# Patient Record
Sex: Female | Born: 1990 | Race: White | Hispanic: No | Marital: Single | Smoking: Never smoker
Health system: Southern US, Community
[De-identification: ages and names within clinical notes are randomized; demographics above are authoritative.]

## PROBLEM LIST (undated history)

## (undated) HISTORY — PX: WISDOM TOOTH EXTRACTION: SHX21

---
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
## 2018-04-06 ENCOUNTER — Other Ambulatory Visit: Payer: Self-pay

## 2018-04-06 ENCOUNTER — Emergency Department (HOSPITAL_COMMUNITY)
Admission: EM | Admit: 2018-04-06 | Discharge: 2018-04-07 | Disposition: A | Discharge status: Home / Self Care | Payer: BLUE CROSS/BLUE SHIELD | Attending: Emergency Medicine | Admitting: Emergency Medicine

## 2018-04-06 ENCOUNTER — Encounter (HOSPITAL_COMMUNITY): Payer: Self-pay | Admitting: Emergency Medicine

## 2018-04-06 DIAGNOSIS — F418 Other specified anxiety disorders: Secondary | ICD-10-CM | POA: Diagnosis present

## 2018-04-06 DIAGNOSIS — F329 Major depressive disorder, single episode, unspecified: Secondary | ICD-10-CM | POA: Diagnosis not present

## 2018-04-06 DIAGNOSIS — R45851 Suicidal ideations: Secondary | ICD-10-CM | POA: Diagnosis not present

## 2018-04-06 DIAGNOSIS — F32A Depression, unspecified: Secondary | ICD-10-CM

## 2018-04-06 LAB — RAPID URINE DRUG SCREEN, HOSP PERFORMED
AMPHETAMINES: NOT DETECTED
Barbiturates: NOT DETECTED
Benzodiazepines: NOT DETECTED
Cocaine: NOT DETECTED
Opiates: NOT DETECTED
TETRAHYDROCANNABINOL: NOT DETECTED

## 2018-04-06 LAB — COMPREHENSIVE METABOLIC PANEL
ALT: 14 U/L (ref 0–44)
ANION GAP: 12 (ref 5–15)
AST: 15 U/L (ref 15–41)
Albumin: 4.8 g/dL (ref 3.5–5.0)
Alkaline Phosphatase: 73 U/L (ref 38–126)
BUN: 12 mg/dL (ref 6–20)
CO2: 21 mmol/L — ABNORMAL LOW (ref 22–32)
Calcium: 9.4 mg/dL (ref 8.9–10.3)
Chloride: 103 mmol/L (ref 98–111)
Creatinine, Ser: 0.7 mg/dL (ref 0.44–1.00)
GFR calc Af Amer: >60 mL/min (ref >60)
GFR calc non Af Amer: >60 mL/min (ref >60)
GLUCOSE: 93 mg/dL (ref 70–99)
Potassium: 3.6 mmol/L (ref 3.5–5.1)
Sodium: 136 mmol/L (ref 135–145)
Total Bilirubin: 0.6 mg/dL (ref 0.3–1.2)
Total Protein: 8.4 g/dL — ABNORMAL HIGH (ref 6.5–8.1)

## 2018-04-06 LAB — CBC
HCT: 46.7 % — ABNORMAL HIGH (ref 36.0–46.0)
HEMOGLOBIN: 15.1 g/dL — AB (ref 12.0–15.0)
MCH: 30.9 pg (ref 26.0–34.0)
MCHC: 32.3 g/dL (ref 30.0–36.0)
MCV: 95.7 fL (ref 80.0–100.0)
Platelets: 330 10*3/uL (ref 150–400)
RBC: 4.88 MIL/uL (ref 3.87–5.11)
RDW: 11.9 % (ref 11.5–15.5)
WBC: 14.3 10*3/uL — ABNORMAL HIGH (ref 4.0–10.5)
nRBC: 0 % (ref 0.0–0.2)

## 2018-04-06 LAB — I-STAT BETA HCG BLOOD, ED (MC, WL, AP ONLY): I-stat hCG, quantitative: <5 m[IU]/mL (ref <5)

## 2018-04-06 LAB — ACETAMINOPHEN LEVEL: Acetaminophen (Tylenol), Serum: <10 ug/mL — ABNORMAL LOW (ref 10–30)

## 2018-04-06 LAB — SALICYLATE LEVEL: Salicylate Lvl: <7 mg/dL (ref 2.8–30.0)

## 2018-04-06 LAB — ETHANOL: Alcohol, Ethyl (B): <10 mg/dL (ref <10)

## 2018-04-06 NOTE — ED Provider Notes (Signed)
[facility]-EMERGENCY DEPT Provider Note   CSN: [id number] Arrival date & time: 04/06/18  2004     History   Chief Complaint Chief Complaint  Patient presents with  . Suicidal    HPI Kendra Mcdonald is a 28 y.o. female.  HPI   Patient is a 28 year old female who presents the emergency department today for evaluation of suicidal ideation.  Patient states that she has been having a lot of increased stress and depression lately because she recently separated from her wife. She has also been I am able to see her son recently because of the separation.  Because of the stress she has been having suicidal thoughts for the last several weeks.  She has no specific plan in place.  She is never attempted suicide in the past.  She has had issues with anxiety/depression in the past and has been to counseling however she is no longer in counseling currently.  She is never been medicated for anxiety depression in the past.  She denies any homicidal ideations, auditory or visual hallucinations.  She denies any medical complaints including no chest pain, shortness of breath, abdominal pain, fevers chills nausea vomiting diarrhea or urinary symptoms.  History reviewed. No pertinent past medical history.  There are no active problems to display for this patient.   Past Surgical History:  Procedure Laterality Date  . WISDOM TOOTH EXTRACTION       OB History   No obstetric history on file.      Home Medications    Prior to Admission medications   Medication Sig Start Date End Date Taking? Authorizing Provider  dextromethorphan-guaiFENesin (MUCINEX DM) 30-600 MG 12hr tablet Take 1 tablet by mouth 2 (two) times daily as needed for cough (congestion).   Yes [provider]    Family History No family history on file.  Social History Social History   Tobacco Use  . Smoking status: Never Smoker  . Smokeless tobacco: Never Used  Substance Use Topics  . Alcohol  use: Yes    Comment: Few times a week  . Drug use: Not Currently     Allergies   Patient has no known allergies.   Review of Systems Review of Systems  Constitutional: Negative for chills and fever.  HENT: Negative for ear pain and sore throat.   Eyes: Negative for visual disturbance.  Respiratory: Negative for cough and shortness of breath.   Cardiovascular: Negative for chest pain.  Gastrointestinal: Negative for abdominal pain, diarrhea, nausea and vomiting.  Genitourinary: Negative for dysuria and hematuria.  Musculoskeletal: Negative for back pain.  Skin: Negative for color change and rash.  Neurological: Negative for headaches.  Psychiatric/Behavioral: Positive for dysphoric mood, sleep disturbance and suicidal ideas. Negative for hallucinations. The patient is nervous/anxious.        No hi or avh  All other systems reviewed and are negative.    Physical Exam Updated Vital Signs BP (!) 155/97 (BP Location: Left Arm)   Pulse (!) 101   Temp (!) 97.5 F (36.4 C) (Oral)   Resp (!) 22   Ht 5\' 3"  (1.6 m)   Wt 65.8 kg   LMP 03/23/2018   SpO2 100%   BMI 25.69 kg/m   Physical Exam Vitals signs and nursing note reviewed.  Constitutional:      General: She is not in acute distress.    Appearance: Normal appearance. She is well-developed.  HENT:     Head: Normocephalic and atraumatic.  Nose: Nose normal.     Mouth/Throat:     Pharynx: No oropharyngeal exudate or posterior oropharyngeal erythema.  Eyes:     Extraocular Movements: Extraocular movements intact.     Conjunctiva/sclera: Conjunctivae normal.     Pupils: Pupils are equal, round, and reactive to light.  Neck:     Musculoskeletal: Neck supple.  Cardiovascular:     Rate and Rhythm: Normal rate and regular rhythm.     Heart sounds: Normal heart sounds. No murmur.  Pulmonary:     Effort: Pulmonary effort is normal. No respiratory distress.     Breath sounds: Normal breath sounds. No stridor. No  wheezing or rhonchi.  Abdominal:     Palpations: Abdomen is soft.     Tenderness: There is no abdominal tenderness. There is no guarding or rebound.  Skin:    General: Skin is warm and dry.  Neurological:     Mental Status: She is alert.  Psychiatric:        Behavior: Behavior normal.        Thought Content: Thought content normal.        Judgment: Judgment normal.     Comments: Anxious, tearful      ED Treatments / Results  Labs (all labs ordered are listed, but only abnormal results are displayed) Labs Reviewed  COMPREHENSIVE METABOLIC PANEL - Abnormal; Notable for the following components:      Result Value   CO2 21 (*)    Total Protein 8.4 (*)    All other components within normal limits  ACETAMINOPHEN LEVEL - Abnormal; Notable for the following components:   Acetaminophen (Tylenol), Serum <10 (*)    All other components within normal limits  CBC - Abnormal; Notable for the following components:   WBC 14.3 (*)    Hemoglobin 15.1 (*)    HCT 46.7 (*)    All other components within normal limits  ETHANOL  SALICYLATE LEVEL  RAPID URINE DRUG SCREEN, HOSP PERFORMED  I-STAT BETA HCG BLOOD, ED ([facility], [facility], [facility] ONLY)    EKG None  Radiology No results found.  Procedures Procedures (including critical care time)  Medications Ordered in ED Medications - No data to display   Initial Impression / Assessment and Plan / ED Course  I have reviewed the triage vital signs and the nursing notes.  Pertinent labs & imaging results that were available during my care of the patient were reviewed by me and considered in my medical decision making (see chart for details).     Final Clinical Impressions(s) / ED Diagnoses   Final diagnoses:  Suicidal ideation   Patient presented for suicidal ideations after multiple stressors including stressors with her significant other her job and her son.  She has no plan in place.  She has never attempted suicide in the past.  She does have  history of anxiety/depression but is not currently on medications or receiving therapy for this.  Denies drug use. Does drink etoh occasionally, has been drinking more lately secondary to stressors. No medical complaints.  Labs with slight leukocytosis, slightly elevated hemoglobin as well, suspect hemoconcentration.  CMP with normal kidney and liver function.  Electrolytes are normal.  EtOH, salicylate and acetaminophen levels are normal.  UDS is negative.  Patient has no acute medical problems at this time that would require further admission or work-up at this time.  She is appropriate for evaluation by behavioral health.  Patient evaluated by behavioral health who recommends overnight observation and evaluation  by psychiatrist in the a.m.  ED Discharge Orders    None       Rayne Du 04/07/18 0004    Tegeler, Canary Brim, MD 04/07/18 [phone]
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
## 2018-04-06 NOTE — ED Notes (Signed)
Bed: [facility]41 Expected date:  Expected time:  Means of arrival:  Comments: 

## 2018-04-06 NOTE — Progress Notes (Signed)
Received Kendra Mcdonald from the main  ED, alert, but sleepy. She went to bed. She denied feeling suicidal, but endorsed feeling depressed. She slept throughout the night in the [facility].

## 2018-04-06 NOTE — ED Notes (Signed)
[facility] TTS in evaluating patient

## 2018-04-06 NOTE — ED Triage Notes (Signed)
Pt presents with suicidal ideation. Patient states feelings have been on and off but tonight it has been worse. Patient stating she has had some issues at work and is going through a separation. Patient states that she does have access to guns but does not have a plan.

## 2018-04-07 DIAGNOSIS — R45851 Suicidal ideations: Secondary | ICD-10-CM

## 2018-04-07 DIAGNOSIS — F32A Depression, unspecified: Secondary | ICD-10-CM | POA: Diagnosis present

## 2018-04-07 DIAGNOSIS — F329 Major depressive disorder, single episode, unspecified: Secondary | ICD-10-CM

## 2018-04-07 NOTE — Consult Note (Signed)
[facility] ED Discharge  04/07/2018 4:08 PM Kendra Mcdonald  MRN:  [id number] Principal Problem: Depression Discharge Diagnoses: Principal Problem:   Depression   Subjective: Per chart review, patient was admitted with SI in the setting of multiple psychosocial stressors. Today she denies SI, HI or AVH. 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 She recently separated from her wife and has been unable to see her 67 y/o son. She denies a prior psychiatric history. She denies a history of suicide attempts. She has received counseling services in the past. She is agreeable to following up with outpatient mental health services. She provides verbal consent to speak to her grandmother. She was contacted. She agrees to remove weapons from her home and will be part of her support network.   Total Time spent with patient: 30 minutes  Past Psychiatric History: Denies   Past Medical History: History reviewed. No pertinent past medical history.  Past Surgical History:  Procedure Laterality Date  . WISDOM TOOTH EXTRACTION     Family History: No family history on file. Family Psychiatric  History: Denies  Social History:  Social History   Substance and Sexual Activity  Alcohol Use Yes   Comment: Few times a week     Social History   Substance and Sexual Activity  Drug Use Not Currently    Social History   Socioeconomic History  . Marital status: Single    Spouse name: Not on file  . Number of children: Not on file  . Years of education: Not on file  . Highest education level: Not on file  Occupational History  . Not on file  Social Needs  . Financial resource strain: Not on file  . Food insecurity:    Worry: Not on file    Inability: Not on file  . Transportation needs:    Medical: Not on file    Non-medical: Not on file  Tobacco Use  . Smoking status: Never Smoker  . Smokeless tobacco: Never Used  Substance and Sexual Activity   . Alcohol use: Yes    Comment: Few times a week  . Drug use: Not Currently  . Sexual activity: Not on file  Lifestyle  . Physical activity:    Days per week: Not on file    Minutes per session: Not on file  . Stress: Not on file  Relationships  . Social connections:    Talks on phone: Not on file    Gets together: Not on file    Attends religious service: Not on file    Active member of club or organization: Not on file    Attends meetings of clubs or organizations: Not on file    Relationship status: Not on file  Other Topics Concern  . Not on file  Social History Narrative  . Not on file    Has this patient used any form of tobacco in the last 30 days? (Cigarettes, Smokeless Tobacco, Cigars, and/or Pipes) Prescription not provided because: Patient does not use tobacco products.  Current Medications: No current facility-administered medications for this encounter.    Current Outpatient Medications  Medication Sig Dispense Refill  . dextromethorphan-guaiFENesin (MUCINEX DM) 30-600 MG 12hr tablet Take 1 tablet by mouth 2 (two) times daily as needed for cough (congestion).     PTA Medications: (Not in a hospital admission)   Musculoskeletal: Strength & Muscle Tone: within normal limits Gait & Station: normal Patient leans: N/A  Psychiatric  Specialty Exam: Physical Exam  Nursing note and vitals reviewed. Constitutional: She is oriented to person, place, and time. She appears well-developed and well-nourished.  HENT:  Head: Normocephalic and atraumatic.  Neck: Normal range of motion.  Respiratory: Effort normal.  Musculoskeletal: Normal range of motion.  Neurological: She is alert and oriented to person, place, and time.  Psychiatric: Her speech is normal and behavior is normal. Judgment and thought content normal. Cognition and memory are normal. She exhibits a depressed mood.    Review of Systems  Psychiatric/Behavioral: Positive for depression. Negative for  hallucinations and suicidal ideas.  All other systems reviewed and are negative.   Blood pressure 123/72, pulse (!) 8, temperature 98.3 F (36.8 C), temperature source Oral, resp. rate 16, height 5\' 3"  (1.6 m), weight 65.8 kg, last menstrual period 03/23/2018, SpO2 96 %.Body mass index is 25.69 kg/m.  General Appearance: Fairly Groomed, young, Caucasian female, wearing paper hospital scrubs and lying in bed. NAD.   Eye Contact:  Good  Speech:  Clear and Coherent and Normal Rate  Volume:  Normal  Mood:  Depressed  Affect:  Congruent and Tearful  Thought Process:  Goal Directed, Linear and Descriptions of Associations: Intact  Orientation:  Full (Time, Place, and Person)  Thought Content:  Logical  Suicidal Thoughts:  No  Homicidal Thoughts:  No  Memory:  Immediate;   Good Recent;   Good Remote;   Good  Judgement:  Fair  Insight:  Fair  Psychomotor Activity:  Normal  Concentration:  Concentration: Good and Attention Span: Good  Recall:  Good  Fund of Knowledge:  Good  Language:  Good  Akathisia:  No  Handed:  Right  AIMS (if indicated):   N/A  Assets:  Communication Skills Desire for Improvement Financial Resources/Insurance Housing Physical Health Social Support  ADL's:  Intact  Cognition:  WNL  Sleep:   N/A     Demographic Factors:  Caucasian, Gay, lesbian, or bisexual orientation and Access to firearms  Loss Factors: Loss of significant relationship  Historical Factors: NA  Risk Reduction Factors:   Responsible for children under 26 years of age and Positive social support  Continued Clinical Symptoms:  Depression  Cognitive Features That Contribute To Risk:  None    Suicide Risk:  Minimal: No identifiable suicidal ideation.  Patients presenting with no risk factors but with morbid ruminations; may be classified as minimal risk based on the severity of the depressive symptoms  Assessment: Kendra Mcdonald is a 28 y.o. female who was admitted with SI in  the setting of psychosocial stressors. She denies current SI, HI or AVH. Her grandmother will assist with safety planning and remove weapons from her home. She is agreeable to following up with outpatient mental health services. She does not warrant inpatient psychiatric hospitalization at this time.   Plan Of Care/Follow-up recommendations:  -Patient will be provided with resources for outpatient mental health services.   Disposition: Discharge home.  Cherly BeachJacqueline J Benuel Ly, DO 04/07/2018, 4:08 PM

## 2018-04-07 NOTE — ED Notes (Signed)
Pt discharged home. Discharged instructions read to pt who verbalized understanding. All belongings returned to pt who signed for same. Denies SI/HI, is not delusional and not responding to internal stimuli. Escorted pt to the ED exit.  Pt's grandmother picked her up.

## 2018-04-07 NOTE — [facility] Counselor (Addendum)
TTS left a voice mail for patient's grandmother, Dondra SpryGail at [phone], to obtain collateral information. TTS left an additional voice mail on her cell phone [phone].  @12 :4941- Spoke with Dondra SpryGail and her other grandma, Bonita QuinLinda, who agreed to go to patient's home and ensure that she does not have access to weapons.

## 2018-04-07 NOTE — BH Assessment (Signed)
[facility] Assessment Progress Note  Per Jacqueline Norman, DO, this pt does not require psychiatric hospitalization at this time.  [facility] is to be discharged from [facility] with recommendation to follow up with [facility].  This has been included in pt's discharge instructions.  Pt's nurse, Diane, has been notified.  Sriman Tally, MA Triage Specialist [phone]     

## 2018-04-07 NOTE — Discharge Instructions (Signed)
For your behavioral health needs you are advised to follow up with [facility].  New patients are seen at their walk-in clinic.  Walk-in hours are Monday - Friday from 8:00 am - 12:00 pm, and from 1:00 pm - 3:00 pm.  Walk-in patients are seen on a first come, first served basis, so try to arrive as early as possible for the best chance of being seen the same day.  There is an initial fee of $22.50: ° °     [facility] °     [address] °     [place],  [address] °     [phone] °

## 2018-10-06 ENCOUNTER — Telehealth: Payer: BLUE CROSS/BLUE SHIELD | Admitting: Family

## 2018-10-06 DIAGNOSIS — J069 Acute upper respiratory infection, unspecified: Secondary | ICD-10-CM

## 2018-10-06 MED ORDER — PROMETHAZINE-DM 6.25-15 MG/5ML PO SYRP: 5.0000 mL | ORAL_SOLUTION | Freq: Four times a day (QID) | ORAL | 0 refills | Status: DC | PRN

## 2018-10-06 NOTE — Progress Notes (Signed)
E-Visit for Corona Virus Screening   Based on your current symptoms, it seems unlikely that your symptoms are related to the Coronavirus.    This appear to be more of an upper respiratory infection.  COVID-19 is a respiratory illness with symptoms that are similar to the flu. Symptoms are typically mild to moderate, but there have been cases of severe illness and death due to the virus. The following symptoms may appear 2-14 days after exposure: . Fever . Cough . Shortness of breath or difficulty breathing . Chills . Repeated shaking with chills . Muscle pain . Headache . Sore throat . New loss of taste or smell . Fatigue . Congestion or runny nose . Nausea or vomiting . Diarrhea  It is vitally important that if you feel that you have an infection such as this virus or any other virus that you stay home and away from places where you may spread it to others.  You should self-quarantine for 14 days if you have symptoms that could potentially be coronavirus or have been in close contact a with a person diagnosed with COVID-19 within the last 2 weeks. You should avoid contact with people age 26 and older.   You should wear a mask or cloth face covering over your nose and mouth if you must be around other people or animals, including pets (even at home). Try to stay at least 6 feet away from other people. This will protect the people around you.  You can use medication such as A prescription cough medication called Phenergan DM 6.25 mg/15 mg. You make take one teaspoon / 5 ml every 4-6 hours as needed for cough  You may also take acetaminophen (Tylenol) as needed for fever.   Reduce your risk of any infection by using the same precautions used for avoiding the common cold or flu:  Marland Kitchen. Wash your hands often with soap and warm water for at least 20 seconds.  If soap and water are not readily available, use an alcohol-based hand sanitizer with at least 60% alcohol.  . If coughing or sneezing,  cover your mouth and nose by coughing or sneezing into the elbow areas of your shirt or coat, into a tissue or into your sleeve (not your hands). . Avoid shaking hands with others and consider head nods or verbal greetings only. . Avoid touching your eyes, nose, or mouth with unwashed hands.  . Avoid close contact with people who are sick. . Avoid places or events with large numbers of people in one location, like concerts or sporting events. . Carefully consider travel plans you have or are making. . If you are planning any travel outside or inside the [place], visit the CDC's Travelers' Health webpage for the latest health notices. . If you have some symptoms but not all symptoms, continue to monitor at home and seek medical attention if your symptoms worsen. . If you are having a medical emergency, call 911.  HOME CARE . Only take medications as instructed by your medical team. . Drink plenty of fluids and get plenty of rest. . A steam or ultrasonic humidifier can help if you have congestion.   GET HELP RIGHT AWAY IF YOU HAVE EMERGENCY WARNING SIGNS** FOR COVID-19. If you or someone is showing any of these signs seek emergency medical care immediately. Call 911 or proceed to your closest emergency facility if: . You develop worsening high fever. . Trouble breathing . Bluish lips or face . Persistent pain or pressure  in the chest . New confusion . Inability to wake or stay awake . You cough up blood. . Your symptoms become more severe  **This list is not all possible symptoms. Contact your medical provider for any symptoms that are sever or concerning to you.   MAKE SURE YOU   Understand these instructions.  Will watch your condition.  Will get help right away if you are not doing well or get worse.  Your e-visit answers were reviewed by a board certified advanced clinical practitioner to complete your personal care plan.  Depending on the condition, your plan could have included  both over the counter or prescription medications.  If there is a problem please reply once you have received a response from your provider.  Your safety is important to [place].  If you have drug allergies check your prescription carefully.    You can use MyChart to ask questions about today's visit, request a non-urgent call back, or ask for a work or school excuse for 24 hours related to this e-Visit. If it has been greater than 24 hours you will need to follow up with your provider, or enter a new e-Visit to address those concerns. You will get an e-mail in the next two days asking about your experience.  I hope that your e-visit has been valuable and will speed your recovery. Thank you for using e-visits.  Greater than 5 minutes, yet less than 10 minutes of time have been spent researching, coordinating, and implementing care for this patient today.  Thank you for the details you included in the comment boxes. Those details are very helpful in determining the best course of treatment for you and help [place] to provide the best care.

## 2019-07-09 ENCOUNTER — Ambulatory Visit: Payer: Medicaid Other

## 2019-07-09 ENCOUNTER — Ambulatory Visit: Payer: Medicaid Other | Attending: Internal Medicine

## 2019-07-09 DIAGNOSIS — Z23 Encounter for immunization: Secondary | ICD-10-CM

## 2019-07-09 NOTE — Progress Notes (Signed)
   Covid-19 Vaccination Clinic  Name:  Kendra Mcdonald    MRN: [id number] DOB: 1990/11/27  07/09/2019  [name] was observed post Covid-19 immunization for 15 minutes without incident. She was provided with Vaccine Information Sheet and instruction to access the V-Safe system.   [name] was instructed to call 911 with any severe reactions post vaccine: Marland Kitchen Difficulty breathing  . Swelling of face and throat  . A fast heartbeat  . A bad rash all over body  . Dizziness and weakness   Immunizations Administered    Name Date Dose VIS Date Route   Pfizer COVID-19 Vaccine 07/09/2019  3:07 PM 0.3 mL 03/12/2019 Intramuscular   Manufacturer: [employer], [employer]   Lot: [id number]   NDC: [address]-3

## 2019-08-02 ENCOUNTER — Ambulatory Visit: Payer: Medicaid Other | Attending: Internal Medicine

## 2019-08-02 DIAGNOSIS — Z23 Encounter for immunization: Secondary | ICD-10-CM

## 2019-08-02 NOTE — Progress Notes (Signed)
   Covid-19 Vaccination Clinic  Name:  Kendra Mcdonald    MRN: [id number] DOB: [birth year]  08/02/2019  [name] was observed post Covid-19 immunization for 15 minutes without incident. She was provided with Vaccine Information Sheet and instruction to access the V-Safe system.   [name] was instructed to call 911 with any severe reactions post vaccine: Marland Kitchen Difficulty breathing  . Swelling of face and throat  . A fast heartbeat  . A bad rash all over body  . Dizziness and weakness   Immunizations Administered    Name Date Dose VIS Date Route   Pfizer COVID-19 Vaccine 08/02/2019 11:10 AM 0.3 mL 05/26/2018 Intramuscular   Manufacturer: [employer], [employer]   Lot: [id number]   NDC: [address]-3

## 2020-05-19 ENCOUNTER — Encounter (HOSPITAL_COMMUNITY): Payer: Self-pay | Admitting: Emergency Medicine

## 2020-05-19 ENCOUNTER — Emergency Department (HOSPITAL_COMMUNITY): Payer: No Typology Code available for payment source

## 2020-05-19 ENCOUNTER — Emergency Department (HOSPITAL_COMMUNITY)
Admission: EM | Admit: 2020-05-19 | Discharge: 2020-05-19 | Disposition: A | Discharge status: Home / Self Care | Payer: No Typology Code available for payment source | Attending: Emergency Medicine | Admitting: Emergency Medicine

## 2020-05-19 DIAGNOSIS — R1032 Left lower quadrant pain: Secondary | ICD-10-CM | POA: Diagnosis present

## 2020-05-19 DIAGNOSIS — N200 Calculus of kidney: Secondary | ICD-10-CM | POA: Insufficient documentation

## 2020-05-19 LAB — CBC WITH DIFFERENTIAL/PLATELET
Abs Immature Granulocytes: 0.05 10*3/uL (ref 0.00–0.07)
Basophils Absolute: 0 10*3/uL (ref 0.0–0.1)
Basophils Relative: 0 %
Eosinophils Absolute: 0.1 10*3/uL (ref 0.0–0.5)
Eosinophils Relative: 1 %
HCT: 40.9 % (ref 36.0–46.0)
Hemoglobin: 13.4 g/dL (ref 12.0–15.0)
Immature Granulocytes: 1 %
Lymphocytes Relative: 13 %
Lymphs Abs: 1.4 10*3/uL (ref 0.7–4.0)
MCH: 30.9 pg (ref 26.0–34.0)
MCHC: 32.8 g/dL (ref 30.0–36.0)
MCV: 94.5 fL (ref 80.0–100.0)
Monocytes Absolute: 0.5 10*3/uL (ref 0.1–1.0)
Monocytes Relative: 5 %
Neutro Abs: 8.4 10*3/uL — ABNORMAL HIGH (ref 1.7–7.7)
Neutrophils Relative %: 80 %
Platelets: 268 10*3/uL (ref 150–400)
RBC: 4.33 MIL/uL (ref 3.87–5.11)
RDW: 12.1 % (ref 11.5–15.5)
WBC: 10.5 10*3/uL (ref 4.0–10.5)
nRBC: 0 % (ref 0.0–0.2)

## 2020-05-19 LAB — URINALYSIS, ROUTINE W REFLEX MICROSCOPIC
Bilirubin Urine: NEGATIVE
Glucose, UA: 50 mg/dL — AB
Ketones, ur: NEGATIVE mg/dL
Leukocytes,Ua: NEGATIVE
Nitrite: NEGATIVE
Protein, ur: NEGATIVE mg/dL
RBC / HPF: >50 RBC/hpf — ABNORMAL HIGH (ref 0–5)
Specific Gravity, Urine: 1.02 (ref 1.005–1.030)
pH: 5 (ref 5.0–8.0)

## 2020-05-19 LAB — COMPREHENSIVE METABOLIC PANEL
ALT: 14 U/L (ref 0–44)
AST: 17 U/L (ref 15–41)
Albumin: 4.5 g/dL (ref 3.5–5.0)
Alkaline Phosphatase: 70 U/L (ref 38–126)
Anion gap: 10 (ref 5–15)
BUN: 12 mg/dL (ref 6–20)
CO2: 22 mmol/L (ref 22–32)
Calcium: 8.8 mg/dL — ABNORMAL LOW (ref 8.9–10.3)
Chloride: 104 mmol/L (ref 98–111)
Creatinine, Ser: 0.84 mg/dL (ref 0.44–1.00)
GFR, Estimated: >60 mL/min (ref >60)
Glucose, Bld: 115 mg/dL — ABNORMAL HIGH (ref 70–99)
Potassium: 3.5 mmol/L (ref 3.5–5.1)
Sodium: 136 mmol/L (ref 135–145)
Total Bilirubin: 0.5 mg/dL (ref 0.3–1.2)
Total Protein: 7.4 g/dL (ref 6.5–8.1)

## 2020-05-19 LAB — I-STAT BETA HCG BLOOD, ED (MC, WL, AP ONLY): I-stat hCG, quantitative: <5 m[IU]/mL (ref <5)

## 2020-05-19 MED ORDER — ONDANSETRON HCL 4 MG/2ML IJ SOLN
4.0000 mg | Freq: Once | INTRAMUSCULAR | Status: AC
  Administered 2020-05-19: 4 mg via INTRAVENOUS
  Filled 2020-05-19: qty 2

## 2020-05-19 MED ORDER — SODIUM CHLORIDE 0.9 % IV BOLUS
500.0000 mL | Freq: Once | INTRAVENOUS | Status: AC
  Administered 2020-05-19: 500 mL via INTRAVENOUS

## 2020-05-19 MED ORDER — HYDROMORPHONE HCL 1 MG/ML IJ SOLN
0.5000 mg | Freq: Once | INTRAMUSCULAR | Status: AC
  Administered 2020-05-19: 0.5 mg via INTRAVENOUS
  Filled 2020-05-19: qty 1

## 2020-05-19 MED ORDER — HYDROMORPHONE HCL 1 MG/ML IJ SOLN
1.0000 mg | Freq: Once | INTRAMUSCULAR | Status: AC
  Administered 2020-05-19: 1 mg via INTRAVENOUS
  Filled 2020-05-19: qty 1

## 2020-05-19 NOTE — Discharge Instructions (Signed)
Continue with oral hydration at home.  Take Motrin Tylenol as needed directed for pain. You can strain your urine to try and catch the kidney stone. Follow-up with urology for any concerns, return to the emergency room for worsening or concerning symptoms.

## 2020-05-19 NOTE — ED Triage Notes (Signed)
Pt reports LLQ abdominal pain that started suddenly around 4a. Worse with palpation. Reports nausea. Denies vomiting or diarrhea. No surgical hx.

## 2020-05-19 NOTE — ED Provider Notes (Signed)
[facility]-EMERGENCY DEPT Provider Note   CSN: [id number] Arrival date & time: 05/19/20  9562     History Chief Complaint  Patient presents with  . Abdominal Pain    Kendra Mcdonald is a 30 y.o. female.  30 year old female with no significant past medical history presents with complaint of left lower abdominal pain onset 4 AM, woke her from her sleep, sharp and aching in nature, constant.  Patient tried to go to the bathroom to relieve her pain, states pain was worse with flexing her hip to sit on the commode.  Reports daily bowel movements, denies constipation, changes in bladder habits, vaginal discharge.  Denies possibility of pregnancy.  Reports nausea without vomiting.  Denies fevers or chills.  No history of ovarian cyst or similar pain previously.  No prior abdominal surgeries.        History reviewed. No pertinent past medical history.  Patient Active Problem List   Diagnosis Date Noted  . Depression     Past Surgical History:  Procedure Laterality Date  . WISDOM TOOTH EXTRACTION       OB History   No obstetric history on file.     History reviewed. No pertinent family history.  Social History   Tobacco Use  . Smoking status: Never Smoker  . Smokeless tobacco: Never Used  Substance Use Topics  . Alcohol use: Yes    Comment: Few times a week  . Drug use: Not Currently    Home Medications Prior to Admission medications   Medication Sig Start Date End Date Taking? Authorizing Provider  acetaminophen (TYLENOL) 325 MG tablet Take 650-975 mg by mouth every 6 (six) hours as needed for mild pain, fever or headache.   Yes [provider]  Ascorbic Acid (VITAMIN C PO) Take 1 tablet by mouth daily.   Yes [provider]  DULoxetine (CYMBALTA) 30 MG capsule Take 60 mg by mouth daily. 01/05/20 04/05/21 Yes [provider]  tiZANidine (ZANAFLEX) 4 MG tablet Take 4-6 mg by mouth at bedtime. 04/28/20 04/29/21 Yes [provider]  VITAMIN D PO Take 1 capsule by mouth daily.   Yes [provider]    Allergies    Patient has no known allergies.  Review of Systems   Review of Systems  Constitutional: Negative for chills and fever.  Respiratory: Negative for shortness of breath.   Cardiovascular: Negative for chest pain.  Gastrointestinal: Positive for abdominal pain and nausea. Negative for blood in stool, constipation, diarrhea and vomiting.  Genitourinary: Negative for dysuria, frequency, vaginal bleeding and vaginal discharge.  Musculoskeletal: Negative for back pain.  Skin: Negative for rash and wound.  Allergic/Immunologic: Negative for immunocompromised state.  Neurological: Negative for weakness.  All other systems reviewed and are negative.   Physical Exam Updated Vital Signs BP 127/71   Pulse 85   Temp (!) 97.5 F (36.4 C) (Oral)   Resp 16   LMP 05/07/2020   SpO2 100%   Physical Exam Vitals and nursing note reviewed.  Constitutional:      General: She is not in acute distress.    Appearance: She is well-developed and well-nourished. She is not diaphoretic.     Comments: Appears uncomfortable.  HENT:     Head: Normocephalic and atraumatic.  Cardiovascular:     Rate and Rhythm: Normal rate and regular rhythm.     Heart sounds: Normal heart sounds.  Pulmonary:     Effort: Pulmonary effort is normal.  Breath sounds: Normal breath sounds.  Abdominal:     Palpations: Abdomen is soft.     Tenderness: There is abdominal tenderness in the left lower quadrant. There is guarding. There is no right CVA tenderness, left CVA tenderness or rebound.  Skin:    General: Skin is warm and dry.     Findings: No erythema or rash.  Neurological:     Mental Status: She is alert and oriented to person, place, and time.  Psychiatric:        Mood and Affect: Mood and affect normal.        Behavior: Behavior normal.     ED Results / Procedures / Treatments   Labs (all labs  ordered are listed, but only abnormal results are displayed) Labs Reviewed  CBC WITH DIFFERENTIAL/PLATELET - Abnormal; Notable for the following components:      Result Value   Neutro Abs 8.4 (*)    All other components within normal limits  COMPREHENSIVE METABOLIC PANEL - Abnormal; Notable for the following components:   Glucose, Bld 115 (*)    Calcium 8.8 (*)    All other components within normal limits  URINALYSIS, ROUTINE W REFLEX MICROSCOPIC - Abnormal; Notable for the following components:   Glucose, UA 50 (*)    Hgb urine dipstick LARGE (*)    RBC / HPF >50 (*)    Bacteria, UA RARE (*)    All other components within normal limits  I-STAT BETA HCG BLOOD, ED ([facility], [facility], [facility] ONLY)    EKG None  Radiology US Transvaginal Non-OB  Result Date: 05/19/2020 CLINICAL DATA:  Left lower quadrant pain EXAM: TRANSABDOMINAL AND TRANSVAGINAL ULTRASOUND OF PELVIS DOPPLER ULTRASOUND OF OVARIES TECHNIQUE: Both transabdominal and transvaginal ultrasound examinations of the pelvis were performed. Transabdominal technique was performed for global imaging of the pelvis including uterus, ovaries, adnexal regions, and pelvic cul-de-sac. It was necessary to proceed with endovaginal exam following the transabdominal exam to visualize the uterus, endometrium, ovaries and adnexa. Color and duplex Doppler ultrasound was utilized to evaluate blood flow to the ovaries. COMPARISON:  None. FINDINGS: Uterus Measurements: 8.3 x 3.0 x 5.2 cm = volume: 67 mL. No fibroids or other mass visualized. Endometrium Thickness: 6 mm in thickness.  No focal abnormality visualized. Right ovary Measurements: 3.6 x 2.4 x 2.5 cm = volume: 11 mL. Normal appearance/no adnexal mass. Left ovary Measurements: 3.9 x 2.4 x 2.2 cm = volume: 11 mL. Normal appearance/no adnexal mass. Pulsed Doppler evaluation of both ovaries demonstrates normal low-resistance arterial and venous waveforms. Other findings No abnormal free fluid. IMPRESSION: No  evidence of ovarian torsion or mass. No acute findings. Electronically Signed   By: Charlett NoseKevin  Dover M.D.   On: 05/19/2020 08:03   US Pelvis Complete  Result Date: 05/19/2020 CLINICAL DATA:  Left lower quadrant pain EXAM: TRANSABDOMINAL AND TRANSVAGINAL ULTRASOUND OF PELVIS DOPPLER ULTRASOUND OF OVARIES TECHNIQUE: Both transabdominal and transvaginal ultrasound examinations of the pelvis were performed. Transabdominal technique was performed for global imaging of the pelvis including uterus, ovaries, adnexal regions, and pelvic cul-de-sac. It was necessary to proceed with endovaginal exam following the transabdominal exam to visualize the uterus, endometrium, ovaries and adnexa. Color and duplex Doppler ultrasound was utilized to evaluate blood flow to the ovaries. COMPARISON:  None. FINDINGS: Uterus Measurements: 8.3 x 3.0 x 5.2 cm = volume: 67 mL. No fibroids or other mass visualized. Endometrium Thickness: 6 mm in thickness.  No focal abnormality visualized. Right ovary Measurements: 3.6 x 2.4 x 2.5  cm = volume: 11 mL. Normal appearance/no adnexal mass. Left ovary Measurements: 3.9 x 2.4 x 2.2 cm = volume: 11 mL. Normal appearance/no adnexal mass. Pulsed Doppler evaluation of both ovaries demonstrates normal low-resistance arterial and venous waveforms. Other findings No abnormal free fluid. IMPRESSION: No evidence of ovarian torsion or mass. No acute findings. Electronically Signed   By: Charlett Nose M.D.   On: 05/19/2020 08:03   [place] Art/Ven Flow Abd Pelv Doppler  Result Date: 05/19/2020 CLINICAL DATA:  Left lower quadrant pain EXAM: TRANSABDOMINAL AND TRANSVAGINAL ULTRASOUND OF PELVIS DOPPLER ULTRASOUND OF OVARIES TECHNIQUE: Both transabdominal and transvaginal ultrasound examinations of the pelvis were performed. Transabdominal technique was performed for global imaging of the pelvis including uterus, ovaries, adnexal regions, and pelvic cul-de-sac. It was necessary to proceed with endovaginal exam  following the transabdominal exam to visualize the uterus, endometrium, ovaries and adnexa. Color and duplex Doppler ultrasound was utilized to evaluate blood flow to the ovaries. COMPARISON:  None. FINDINGS: Uterus Measurements: 8.3 x 3.0 x 5.2 cm = volume: 67 mL. No fibroids or other mass visualized. Endometrium Thickness: 6 mm in thickness.  No focal abnormality visualized. Right ovary Measurements: 3.6 x 2.4 x 2.5 cm = volume: 11 mL. Normal appearance/no adnexal mass. Left ovary Measurements: 3.9 x 2.4 x 2.2 cm = volume: 11 mL. Normal appearance/no adnexal mass. Pulsed Doppler evaluation of both ovaries demonstrates normal low-resistance arterial and venous waveforms. Other findings No abnormal free fluid. IMPRESSION: No evidence of ovarian torsion or mass. No acute findings. Electronically Signed   By: Charlett Nose M.D.   On: 05/19/2020 08:03   CT Renal Stone Study  Result Date: 05/19/2020 CLINICAL DATA:  Flank pain and left lower quadrant abdominal pain EXAM: CT ABDOMEN AND PELVIS WITHOUT CONTRAST TECHNIQUE: Multidetector CT imaging of the abdomen and pelvis was performed following the standard protocol without IV contrast. COMPARISON:  None. FINDINGS: Lower chest: Included lung bases are clear.  Heart size is normal. Hepatobiliary: Unremarkable unenhanced appearance of the liver. 4 mm low-density lesion within the periphery of the right hepatic lobe (series 2, image 28), too small to definitively characterize, but most likely represents a cyst. Gallbladder within normal limits. No hyperdense gallstone. No biliary dilatation. Pancreas: Unremarkable. No pancreatic ductal dilatation or surrounding inflammatory changes. Spleen: Normal in size without focal abnormality. Adrenals/Urinary Tract: Unremarkable adrenal glands. Mild left perinephric stranding. No renal stone or hydronephrosis. Bilateral ureters are unremarkable. There is a 3 mm stone located within the dependent portion of the urinary bladder lumen.  No urinary bladder wall thickening. Stomach/Bowel: Stomach is within normal limits. Appendix appears normal (series 5, image 45). No evidence of bowel wall thickening, distention, or inflammatory changes. Vascular/Lymphatic: No significant vascular findings are present. No enlarged abdominal or pelvic lymph nodes. Reproductive: Uterus and bilateral adnexa are unremarkable. Other: No free fluid. No abdominopelvic fluid collection. No pneumoperitoneum. No abdominal wall hernia. Musculoskeletal: No acute or significant osseous findings. IMPRESSION: 1. Single 3 mm stone located within the dependent portion of the urinary bladder lumen, likely representing a recently passed stone. 2. Mild left perinephric stranding, likely related to a recently passed stone. No hydronephrosis. Correlate with urinalysis to exclude the possibility of pyelonephritis. 3. Otherwise, no acute findings in the abdomen or pelvis. Normal appendix. Electronically Signed   By: Duanne Guess D.O.   On: 05/19/2020 11:19    Procedures Procedures   Medications Ordered in ED Medications  ondansetron (ZOFRAN) injection 4 mg (4 mg Intravenous Given 05/19/20 0733)  sodium chloride 0.9 % bolus 500 mL (0 mLs Intravenous Stopped 05/19/20 0800)  HYDROmorphone (DILAUDID) injection 1 mg (1 mg Intravenous Given 05/19/20 0735)  HYDROmorphone (DILAUDID) injection 0.5 mg (0.5 mg Intravenous Given 05/19/20 1007)    ED Course  I have reviewed the triage vital signs and the nursing notes.  Pertinent labs & imaging results that were available during my care of the patient were reviewed by me and considered in my medical decision making (see chart for details).  Clinical Course as of 05/19/20 1142  Fri May 19, 2020  [id number] 30 year old female with complaint of left lower quadrant abdominal pain which woke her from her sleep today.  On exam, has tenderness left lower quadrant, appears uncomfortable, rolling around in the bed. Patient was given IV Dilaudid  and IV fluids, ultrasound obtained to evaluate for torsion. Ultrasound shows flow to both ovaries, negative for torsion. CBC unremarkable, CMP without significant findings, includes normal renal function.  hCG is negative.  Urinalysis shows large amount of blood with greater than 50 red blood cells, patient is not on her menstrual cycle currently. CT without contrast of her to evaluate for possible kidney stone, shows 3 mm stone in the bladder, likely passed from left side. Patient reports pain has resolved at this time, plan is to discharge, may strain all urine, Motrin Tylenol as needed for pain, follow with urology as needed. [LM]    Clinical Course User Index [LM] Alden Hipp   MDM Rules/Calculators/A&P                          Final Clinical Impression(s) / ED Diagnoses Final diagnoses:  Kidney stone    Rx / DC Orders ED Discharge Orders    None       Alden Hipp 05/19/20 1142    Lorre Nick, MD 05/23/20 1008

## 2021-03-24 IMAGING — CT CT RENAL STONE PROTOCOL
2 of 4 series · 15 of 46 positions shown, 17 images · non-contrast
Comparison: None.

CLINICAL DATA: Flank pain and left lower quadrant abdominal pain

EXAM:
CT ABDOMEN AND PELVIS WITHOUT CONTRAST
TECHNIQUE: Multidetector CT imaging of the abdomen and pelvis was performed
following the standard protocol without IV contrast.

[Series 2: axial st · axial · 0.63mm/px · z∈[+948,+1373]mm · 12 of 96 slices shown, 14 images]
[im 6/96  soft-tissue]
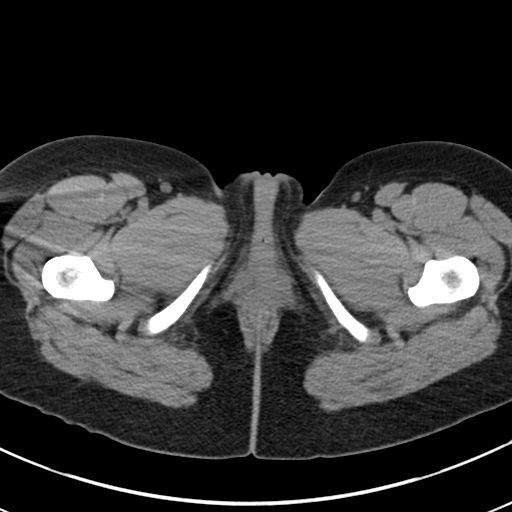
[im 6/96  bone]
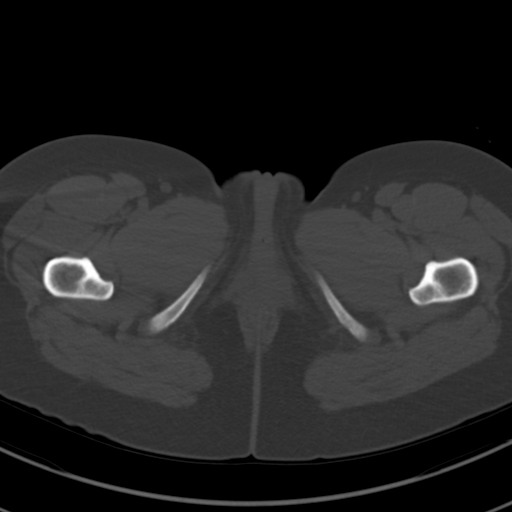
[im 16/96  soft-tissue]
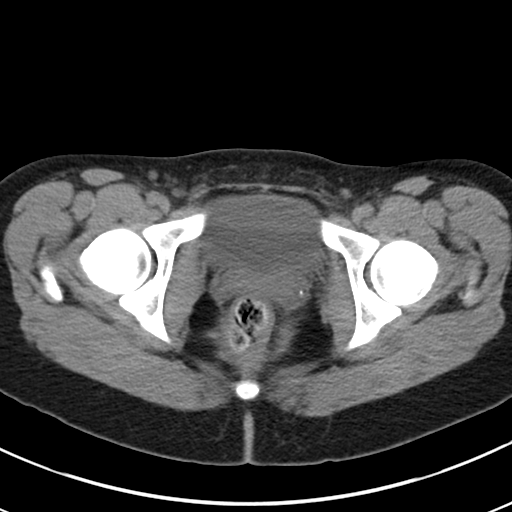
[im 21/96  soft-tissue]
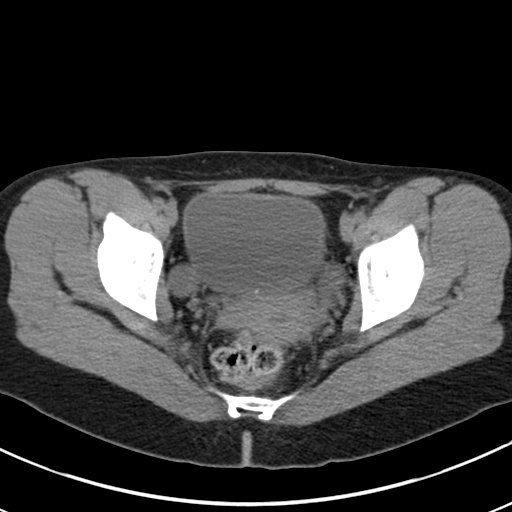
[im 31/96  soft-tissue]
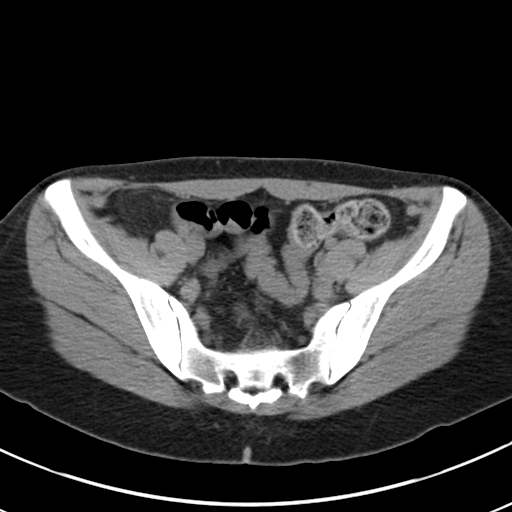
[im 36/96  soft-tissue]
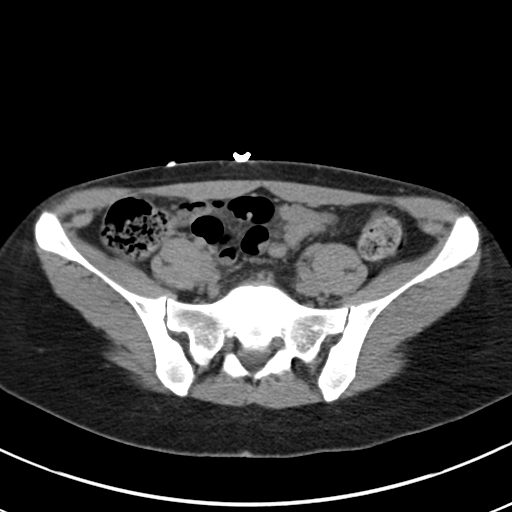
[im 46/96  soft-tissue]
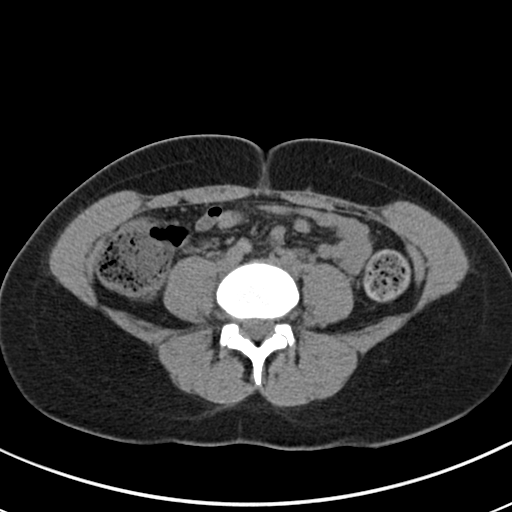
[im 51/96  soft-tissue]
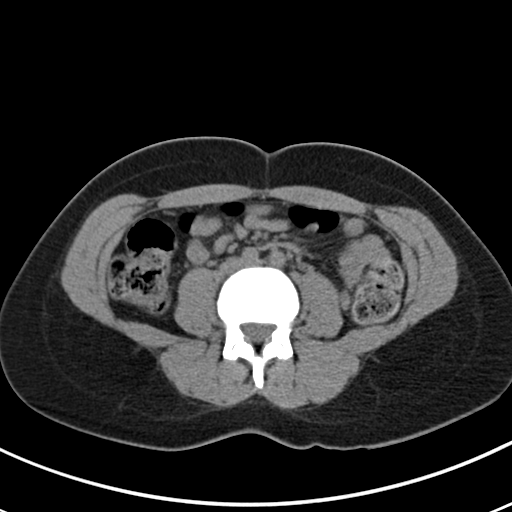
[im 61/96  soft-tissue]
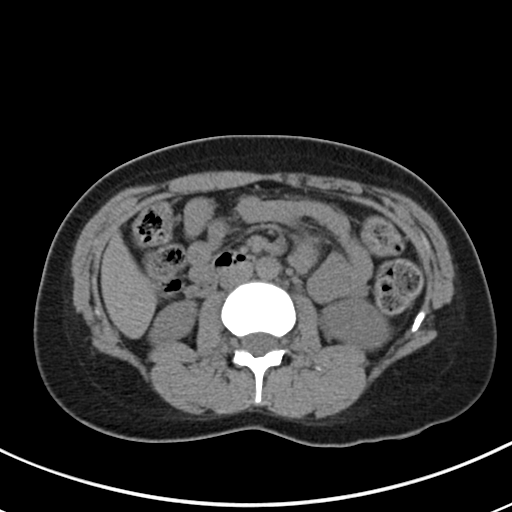
[im 66/96  soft-tissue]
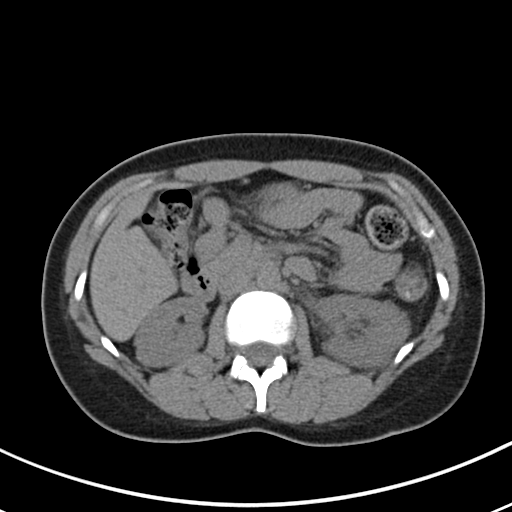
[im 66/96  bone]
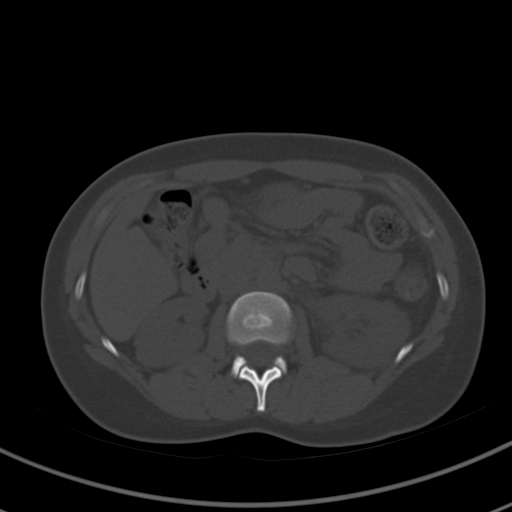
[im 76/96  soft-tissue]
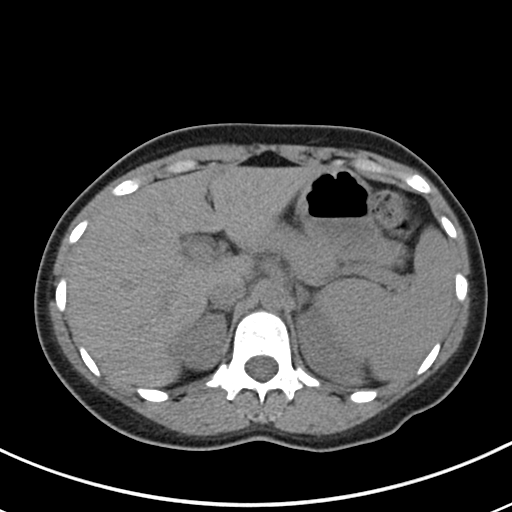
[im 81/96  soft-tissue]
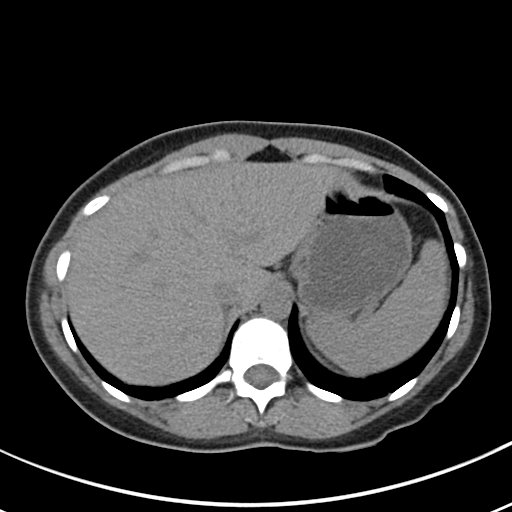
[im 91/96  soft-tissue]
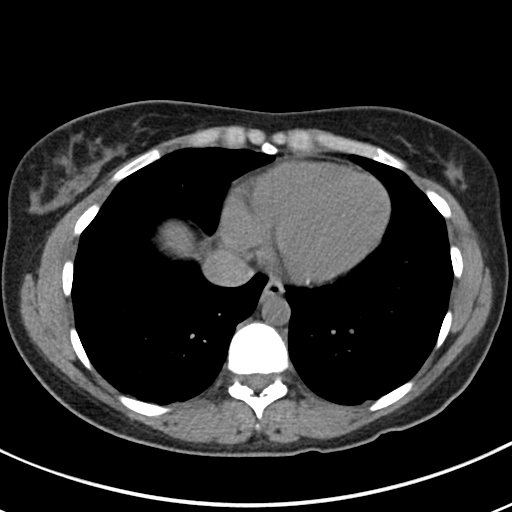

[Series 5: coronal · coronal · 0.64mm/px · 3 of 121 slices shown]
[im 41/121  soft-tissue]
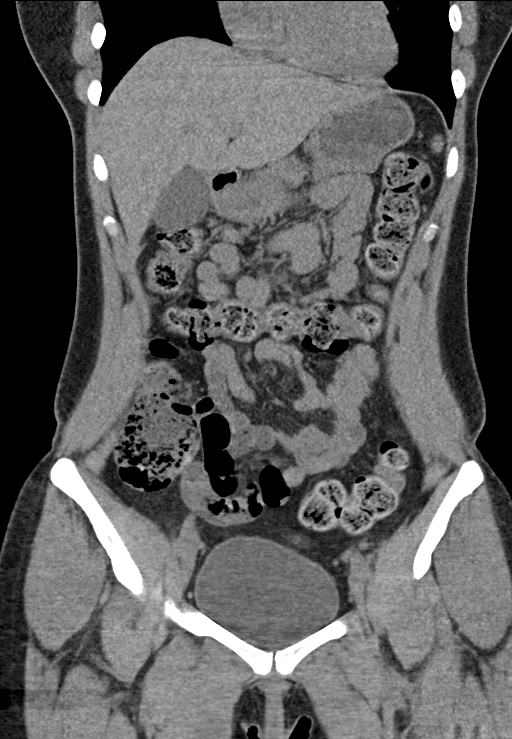
[im 54/121  soft-tissue]
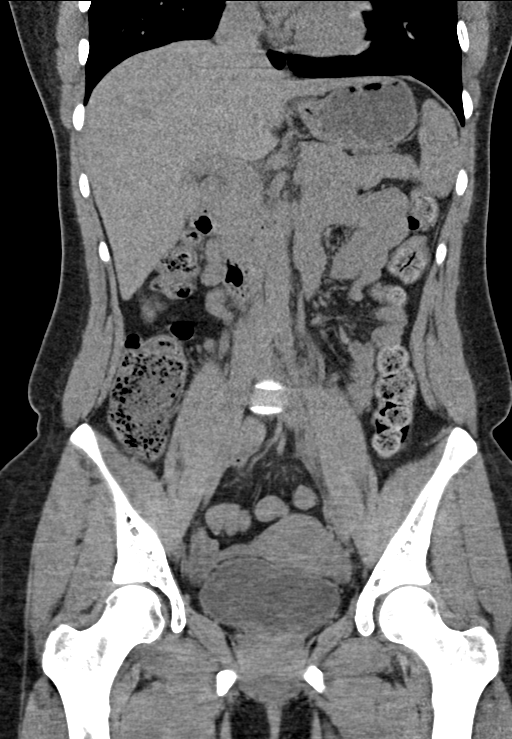
[im 67/121  soft-tissue]
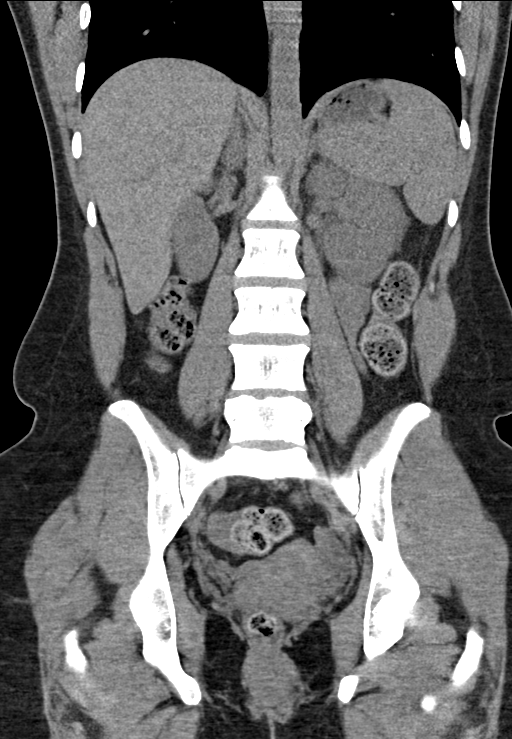

[15 of 46 positions shown; findings below may reference images not displayed]

FINDINGS: Lower chest: Included lung bases are clear.  Heart size is normal.

Hepatobiliary: Unremarkable unenhanced appearance of the liver. 4 mm
low-density lesion within the periphery of the right hepatic lobe
(series 2, image 28), too small to definitively characterize, but
most likely represents a cyst. Gallbladder within normal limits. No
hyperdense gallstone. No biliary dilatation.

Pancreas: Unremarkable. No pancreatic ductal dilatation or
surrounding inflammatory changes.

Spleen: Normal in size without focal abnormality.

Adrenals/Urinary Tract: Unremarkable adrenal glands. Mild left
perinephric stranding. No renal stone or hydronephrosis. Bilateral
ureters are unremarkable. There is a 3 mm stone located within the
dependent portion of the urinary bladder lumen. No urinary bladder
wall thickening.

Stomach/Bowel: Stomach is within normal limits. Appendix appears
normal (series 5, image 45). No evidence of bowel wall thickening,
distention, or inflammatory changes.

Vascular/Lymphatic: No significant vascular findings are present. No
enlarged abdominal or pelvic lymph nodes.

Reproductive: Uterus and bilateral adnexa are unremarkable.

Other: No free fluid. No abdominopelvic fluid collection. No
pneumoperitoneum. No abdominal wall hernia.

Musculoskeletal: No acute or significant osseous findings.
IMPRESSION: 1. Single 3 mm stone located within the dependent portion of the
urinary bladder lumen, likely representing a recently passed stone.
2. Mild left perinephric stranding, likely related to a recently
passed stone. No hydronephrosis. Correlate with urinalysis to
exclude the possibility of pyelonephritis.
3. Otherwise, no acute findings in the abdomen or pelvis. Normal
appendix.

## 2022-06-12 ENCOUNTER — Telehealth: Payer: Self-pay

## 2022-06-12 NOTE — Telephone Encounter (Signed)
Mychart msg sent

## 2023-06-04 ENCOUNTER — Emergency Department (HOSPITAL_COMMUNITY)
Admission: EM | Admit: 2023-06-04 | Discharge: 2023-06-04 | Discharge status: Against Medical Advice | Attending: Emergency Medicine | Admitting: Emergency Medicine

## 2023-06-04 ENCOUNTER — Other Ambulatory Visit: Payer: Self-pay

## 2023-06-04 DIAGNOSIS — G43909 Migraine, unspecified, not intractable, without status migrainosus: Secondary | ICD-10-CM | POA: Diagnosis not present

## 2023-06-04 DIAGNOSIS — R519 Headache, unspecified: Secondary | ICD-10-CM | POA: Diagnosis present

## 2023-06-04 LAB — BASIC METABOLIC PANEL
Anion gap: 8 (ref 5–15)
BUN: 9 mg/dL (ref 6–20)
CO2: 23 mmol/L (ref 22–32)
Calcium: 8.5 mg/dL — ABNORMAL LOW (ref 8.9–10.3)
Chloride: 105 mmol/L (ref 98–111)
Creatinine, Ser: 0.78 mg/dL (ref 0.44–1.00)
GFR, Estimated: >60 mL/min (ref >60)
Glucose, Bld: 93 mg/dL (ref 70–99)
Potassium: 4.2 mmol/L (ref 3.5–5.1)
Sodium: 136 mmol/L (ref 135–145)

## 2023-06-04 LAB — CBC
HCT: 38.9 % (ref 36.0–46.0)
Hemoglobin: 12.9 g/dL (ref 12.0–15.0)
MCH: 30.9 pg (ref 26.0–34.0)
MCHC: 33.2 g/dL (ref 30.0–36.0)
MCV: 93.3 fL (ref 80.0–100.0)
Platelets: 324 10*3/uL (ref 150–400)
RBC: 4.17 MIL/uL (ref 3.87–5.11)
RDW: 12.2 % (ref 11.5–15.5)
WBC: 6.9 10*3/uL (ref 4.0–10.5)
nRBC: 0 % (ref 0.0–0.2)

## 2023-06-04 LAB — HCG, SERUM, QUALITATIVE: Preg, Serum: NEGATIVE

## 2023-06-04 NOTE — ED Triage Notes (Signed)
 Patient c/o migraine headache x 1 week. Patient report she's been taking PRN tylenol and advil without relief. Patient denies N/V. Patient denies fever.
